# Patient Record
Sex: Female | Born: 1985 | Race: White | Hispanic: No | Marital: Married | Smoking: Never smoker
Health system: Southern US, Community
[De-identification: ages and names within clinical notes are randomized; demographics above are authoritative.]

## PROBLEM LIST (undated history)

## (undated) DIAGNOSIS — L309 Dermatitis, unspecified: Secondary | ICD-10-CM

## (undated) DIAGNOSIS — Z789 Other specified health status: Secondary | ICD-10-CM

## (undated) HISTORY — DX: Dermatitis, unspecified: L30.9

## (undated) HISTORY — PX: NO PAST SURGERIES: SHX2092

---

## 2012-02-05 ENCOUNTER — Other Ambulatory Visit: Payer: Self-pay | Admitting: Family Medicine

## 2014-03-09 ENCOUNTER — Ambulatory Visit (INDEPENDENT_AMBULATORY_CARE_PROVIDER_SITE_OTHER): Payer: No Typology Code available for payment source | Admitting: Family Medicine

## 2014-03-09 ENCOUNTER — Ambulatory Visit (INDEPENDENT_AMBULATORY_CARE_PROVIDER_SITE_OTHER): Payer: No Typology Code available for payment source

## 2014-03-09 VITALS — BP 100/60 | HR 69 | Temp 98.1°F | Resp 16 | Ht 63.0 in | Wt 122.0 lb

## 2014-03-09 DIAGNOSIS — S99929A Unspecified injury of unspecified foot, initial encounter: Secondary | ICD-10-CM

## 2014-03-09 DIAGNOSIS — S90129A Contusion of unspecified lesser toe(s) without damage to nail, initial encounter: Secondary | ICD-10-CM

## 2014-03-09 DIAGNOSIS — S99922A Unspecified injury of left foot, initial encounter: Secondary | ICD-10-CM

## 2014-03-09 DIAGNOSIS — S99919A Unspecified injury of unspecified ankle, initial encounter: Secondary | ICD-10-CM

## 2014-03-09 DIAGNOSIS — S8990XA Unspecified injury of unspecified lower leg, initial encounter: Secondary | ICD-10-CM

## 2014-03-09 DIAGNOSIS — S90122A Contusion of left lesser toe(s) without damage to nail, initial encounter: Secondary | ICD-10-CM

## 2014-03-09 LAB — POCT URINE PREGNANCY: Preg Test, Ur: NEGATIVE

## 2014-03-09 NOTE — Patient Instructions (Signed)
Ice and elevate your foot when you can.  You can "buddy tape" your 3rd and 4th toes for support.  I do not see a fracture of your toe, but I will let you know if the radiologist does see a fracture.

## 2014-03-09 NOTE — Progress Notes (Signed)
[facility] [address], [place] [address] [phone]  Date:  03/09/2014   Name:  Linda Roman   DOB:  [birth year]   MRN:  [id number]  PCP:  No PCP Per Patient    Chief Complaint: Toe Injury   History of Present Illness:  Linda Roman is a 28 y.o. very pleasant female patient who presents with the following:  Here today with an injury- last night she stubbed her left 4th toe on some stone steps.  She is able to "hobble" but the toe is painful.   She is generally healthy, otherwise unhurt and has no other complaint today Her LMP was about 6 weeks ago.   She is SA but they use condoms.  Her menses are irregular at baseline and she is not worried about pregnancy   There are no active problems to display for this patient.   Past Medical History  Diagnosis Date  . Eczema     History reviewed. No pertinent past surgical history.  History  Substance Use Topics  . Smoking status: Never Smoker   . Smokeless tobacco: Not on file  . Alcohol Use: Not on file    Family History  Problem Relation Age of Onset  . Diabetes Father   . High Cholesterol Father   . Heart disease Maternal Grandmother   . Heart disease Maternal Grandfather   . Diabetes Maternal Grandfather   . High Cholesterol Paternal Grandmother   . High Cholesterol Paternal Grandfather     No Known Allergies  Medication list has been reviewed and updated.  No current outpatient prescriptions on file prior to visit.   No current facility-administered medications on file prior to visit.    Review of Systems:  As per HPI- otherwise negative.   Physical Examination: Filed Vitals:   03/09/14 1513  BP: 100/60  Pulse: 69  Temp: 98.1 F (36.7 C)  Resp: 16   Filed Vitals:   03/09/14 1513  Height: 5\' 3"  (1.6 m)  Weight: 122 lb (55.339 kg)   Body mass index is 21.62 kg/(m^2). Ideal Body Weight: Weight in (lb) to have BMI = 25: 140.8  GEN: WDWN, NAD, Non-toxic, A & O x 3,  slim, looks well HEENT: Atraumatic, Normocephalic. Neck supple. No masses, No LAD. Ears and Nose: No external deformity. CV: RRR, No M/G/R. No JVD. No thrill. No extra heart sounds. PULM: CTA B, no wheezes, crackles, rhonchi. No retractions. No resp. distress. No accessory muscle use. EXTR: No c/c/e NEURO Normal gait. Not favoring left foot PSYCH: Normally interactive. Conversant. Not depressed or anxious appearing.  Calm demeanor.  Left foot: she is tender and bruised over the left 4th toe.  No swelling or deformity, no wound.  The rest of her foot and ankle are negative   [facility] reading (PRIMARY) by  Dr. Lorelei Pont. Left foot: negative for fracture  LEFT FOOT - COMPLETE 3+ VIEW  COMPARISON: None.  FINDINGS: There is no evidence of fracture or dislocation. There is no evidence of arthropathy or other focal bone abnormality. Soft tissues are unremarkable.  IMPRESSION: Negative.  Results for orders placed in visit on 03/09/14  POCT URINE PREGNANCY      Result Value Ref Range   Preg Test, Ur Negative      Assessment and Plan: Injury of left toe, initial encounter - Plan: DG Foot Complete Left, POCT urine pregnancy  Toe contusion, left, initial encounter  Contused toe.  Buddy taped to the 3rd toe.  Continue this until no longer painful.  Let me know if pain persists beyond a couple of weeks  See patient instructions for more details.     Signed Lamar Blinks, MD

## 2015-05-25 IMAGING — CR DG FOOT COMPLETE 3+V*L*
3 series · 3 of 3 positions shown · non-contrast
Comparison: None.

CLINICAL DATA: Pain in the left fourth toe

EXAM:
LEFT FOOT - COMPLETE 3+ VIEW

[AP]
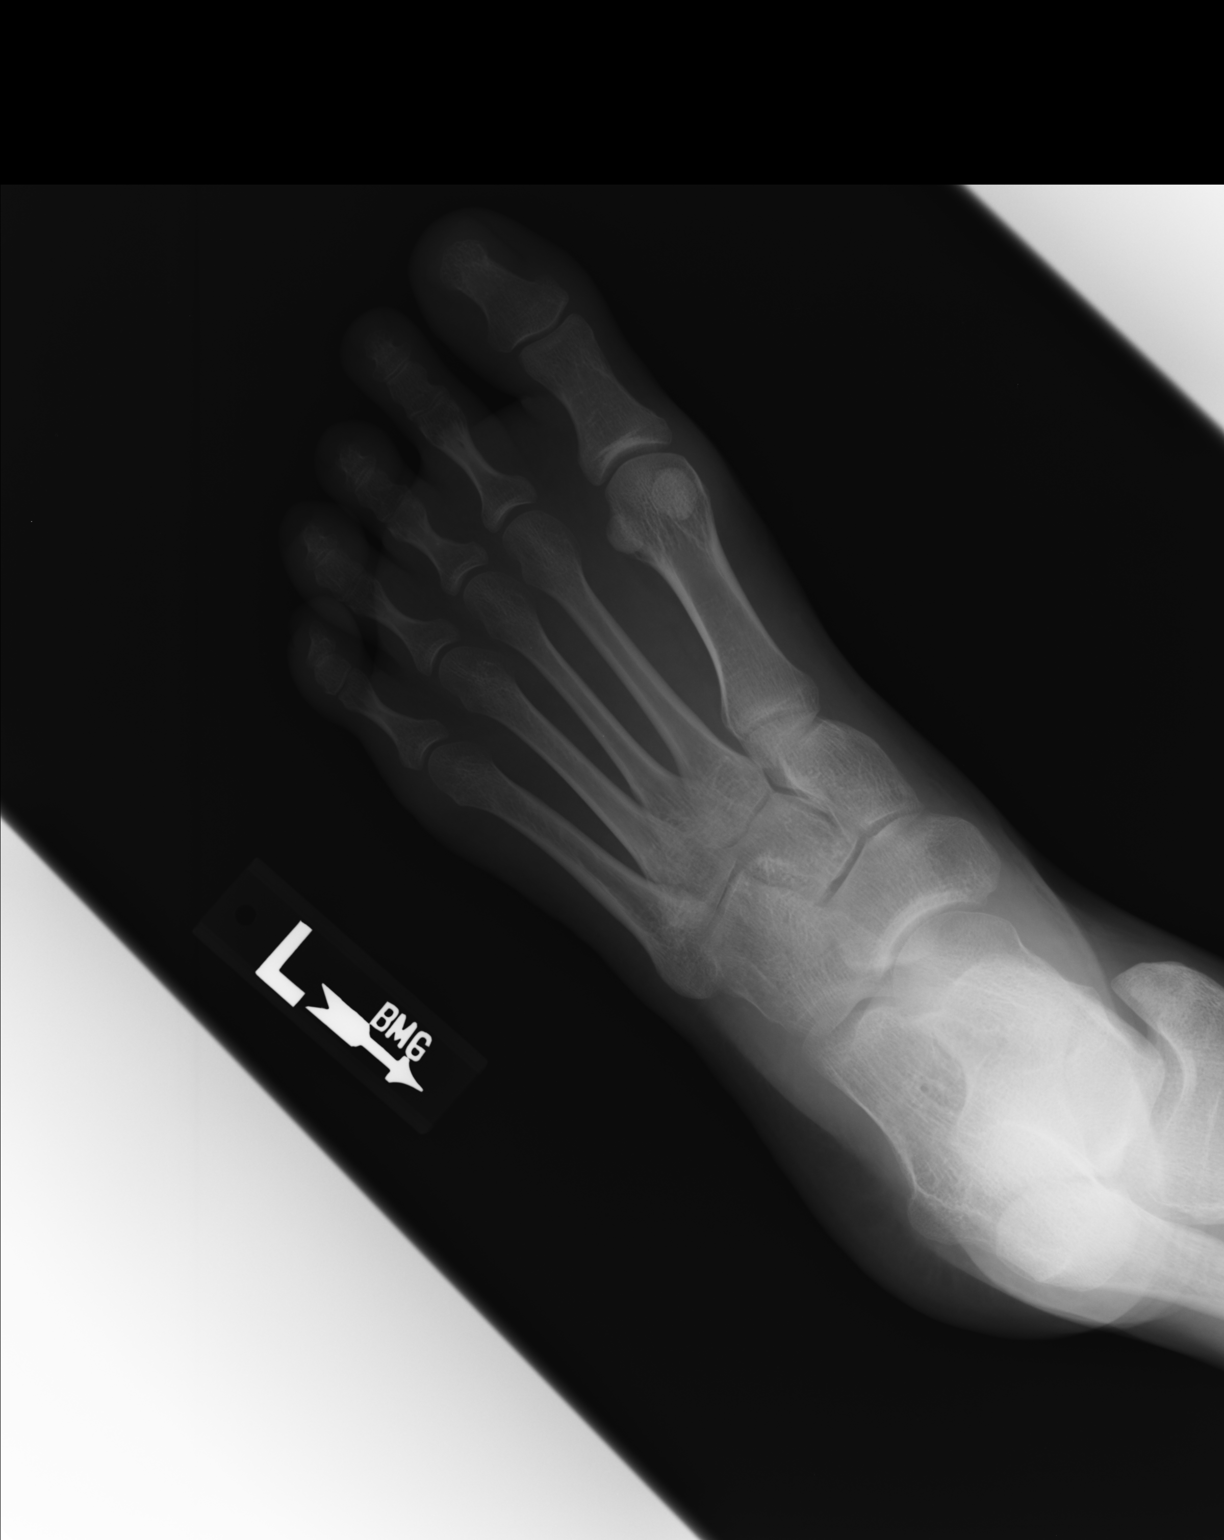

[ap obl int rot]
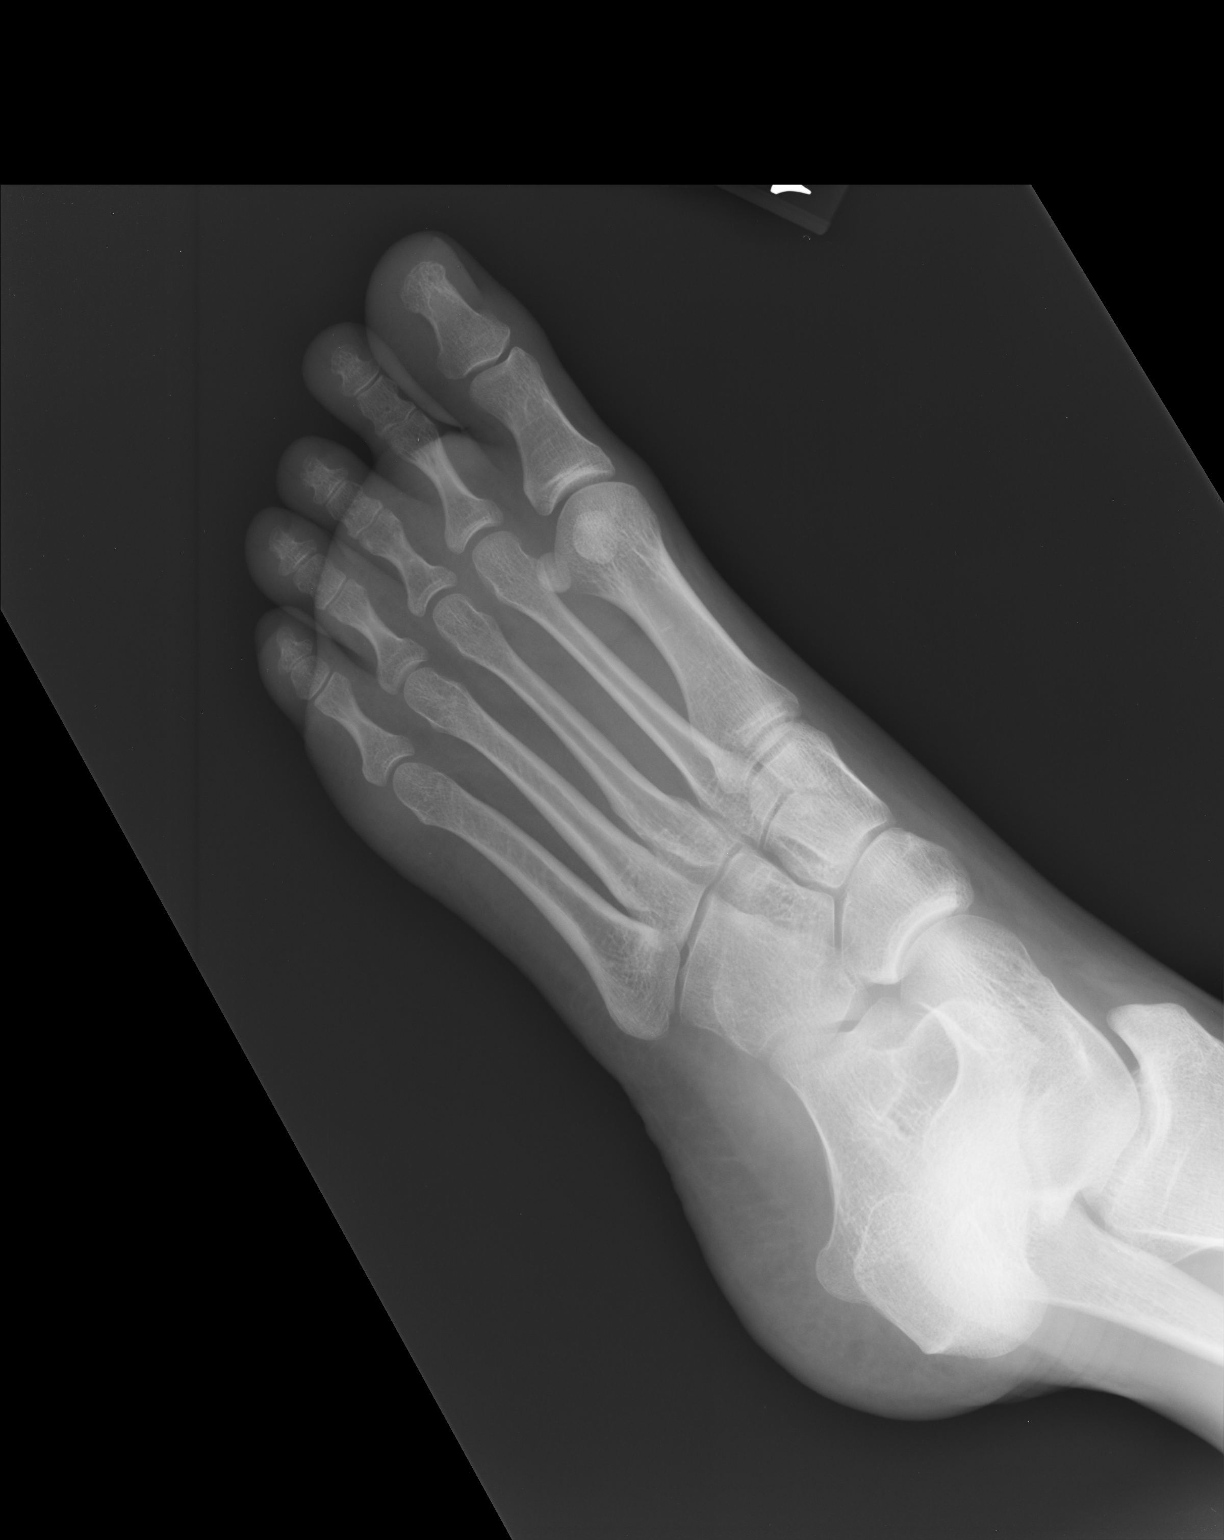

[lateral]
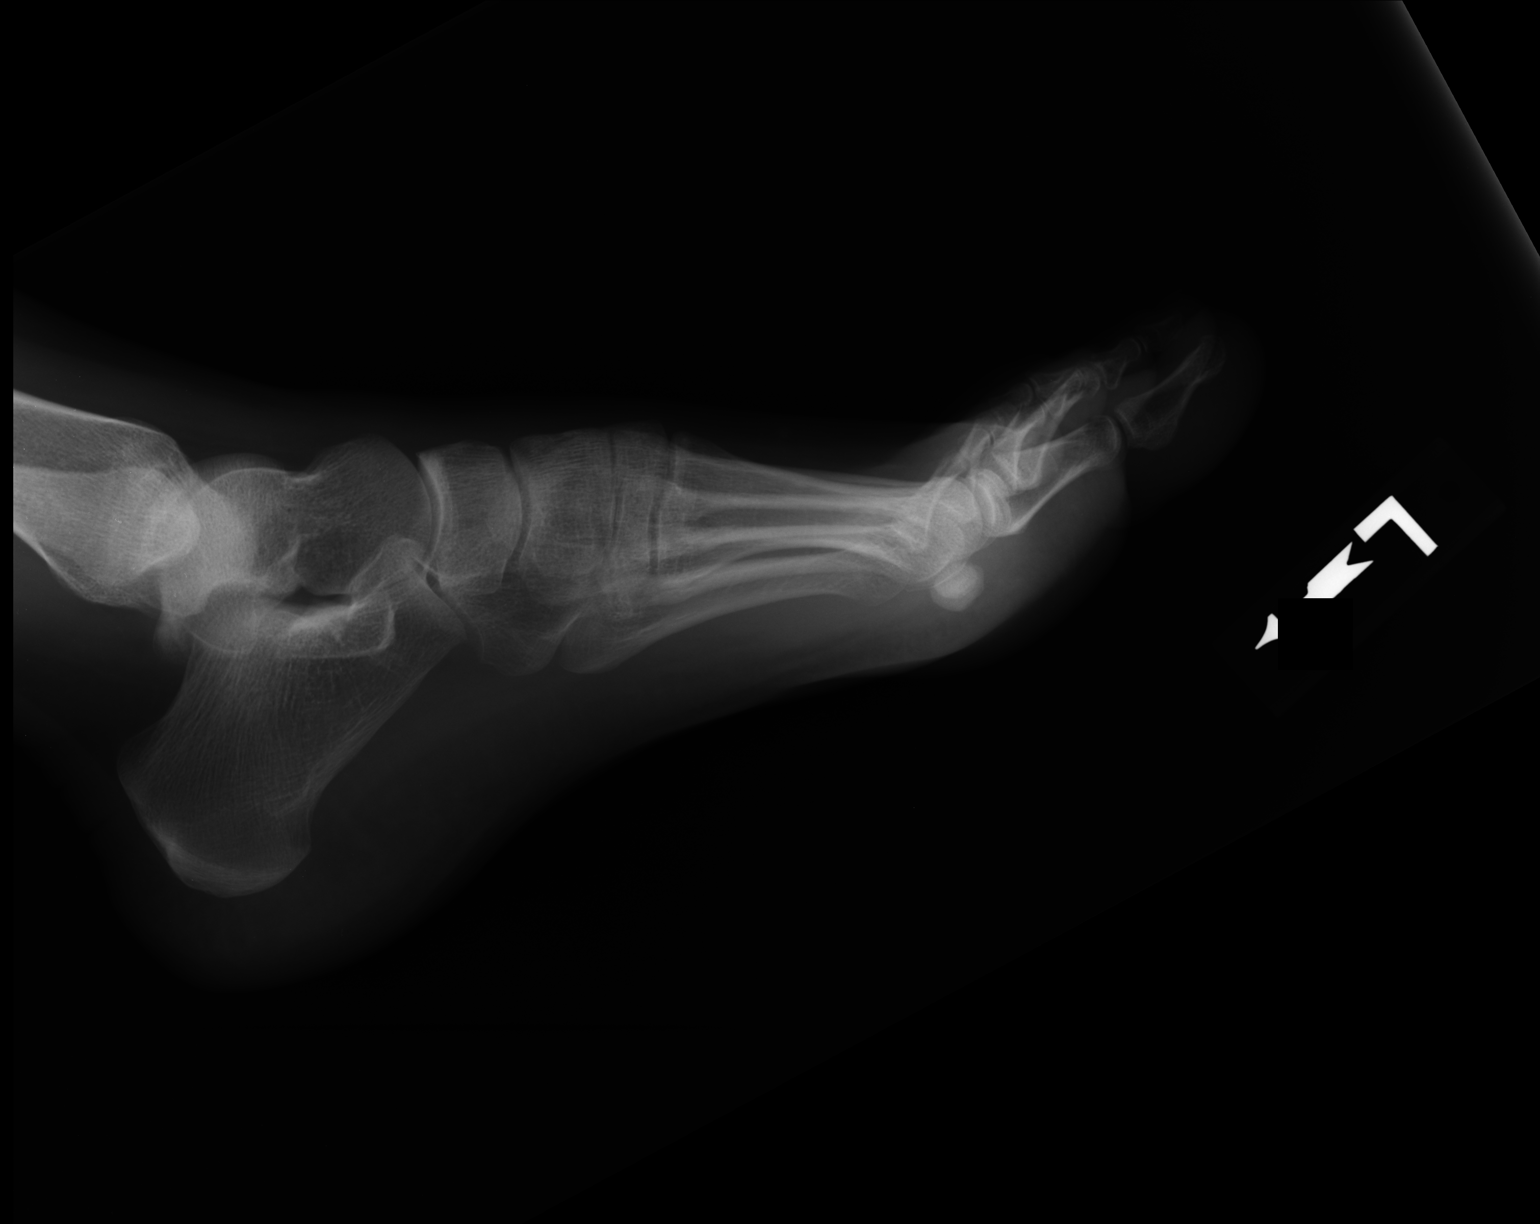

[3 of 3 positions shown; findings below may reference images not displayed]

FINDINGS: There is no evidence of fracture or dislocation. There is no
evidence of arthropathy or other focal bone abnormality. Soft
tissues are unremarkable.
IMPRESSION: Negative.

## 2017-02-21 LAB — OB RESULTS CONSOLE ABO/RH: RH Type: POSITIVE

## 2017-02-21 LAB — OB RESULTS CONSOLE ANTIBODY SCREEN: ANTIBODY SCREEN: NEGATIVE

## 2017-02-21 LAB — OB RESULTS CONSOLE HIV ANTIBODY (ROUTINE TESTING): HIV: NONREACTIVE

## 2017-02-21 LAB — OB RESULTS CONSOLE GC/CHLAMYDIA
CHLAMYDIA, DNA PROBE: NEGATIVE
GC PROBE AMP, GENITAL: NEGATIVE

## 2017-02-21 LAB — OB RESULTS CONSOLE RPR: RPR: NONREACTIVE

## 2017-02-21 LAB — OB RESULTS CONSOLE RUBELLA ANTIBODY, IGM: RUBELLA: IMMUNE

## 2017-02-21 LAB — OB RESULTS CONSOLE HEPATITIS B SURFACE ANTIGEN: Hepatitis B Surface Ag: NEGATIVE

## 2017-08-14 LAB — OB RESULTS CONSOLE GBS: STREP GROUP B AG: POSITIVE

## 2017-09-11 NOTE — L&D Delivery Note (Signed)
Operative Delivery Note Completely dilated at 1.15 pm. Started to push at 2.30 pm, station at +2/5, LOT position, after 20 min with no progress, we stopped and turned pt in lateral position with peanut. Restarted pushing 3.30 pm, good effort, favoring OP and failed manual rotation. Thick perineal body despite pushing and head at +3 +4.  D/w pt episiotomy and she agreed. After episiotomy head delivery was still difficult due to OP position and maternal effort getting weaker. D/w pt vacuum assisted delivery, reviewed risks/complication but the application was at +4 station mainly to achieve flexion and rotation from asynclitic position and agreed.   At 5:52 PM a viable and healthy female was delivered via Vaginal, Kiwi vacuum assisted with one controlled pull and 3 pushing efforts over one contraction. Presentation: vertex; Position: Left,, Occiput,, Anterior; Station: +4.  APGAR: 8, 8; weight -pending  Placenta status: spontaneous and complete .   Cord:  with the following complications: None.  Cord pH (arterial) sent   Anesthesia:  Epidural and local 1% plain lidocaine Instruments: Kiwi Episiotomy: Median (2nd degree, no extension) Lacerations: none other Suture Repair: 3.0 vicryl rapide Est. Blood Loss (mL):  300  Mom to postpartum.  Baby to Couplet care / Skin to Skin.  Linda Roman 09/14/2017, 7:05 PM

## 2017-09-13 ENCOUNTER — Inpatient Hospital Stay (HOSPITAL_COMMUNITY)
Admission: AD | Admit: 2017-09-13 | Discharge: 2017-09-16 | DRG: 806 | Disposition: A | Discharge status: Home / Self Care | Payer: Commercial Managed Care - PPO | Source: Ambulatory Visit | Attending: Obstetrics & Gynecology | Admitting: Obstetrics & Gynecology

## 2017-09-13 ENCOUNTER — Encounter (HOSPITAL_COMMUNITY): Payer: Self-pay

## 2017-09-13 ENCOUNTER — Other Ambulatory Visit: Payer: Self-pay

## 2017-09-13 DIAGNOSIS — D62 Acute posthemorrhagic anemia: Secondary | ICD-10-CM | POA: Diagnosis not present

## 2017-09-13 DIAGNOSIS — D259 Leiomyoma of uterus, unspecified: Secondary | ICD-10-CM | POA: Diagnosis present

## 2017-09-13 DIAGNOSIS — Z3A39 39 weeks gestation of pregnancy: Secondary | ICD-10-CM

## 2017-09-13 DIAGNOSIS — O9962 Diseases of the digestive system complicating childbirth: Secondary | ICD-10-CM | POA: Diagnosis present

## 2017-09-13 DIAGNOSIS — E669 Obesity, unspecified: Secondary | ICD-10-CM | POA: Diagnosis present

## 2017-09-13 DIAGNOSIS — O9081 Anemia of the puerperium: Secondary | ICD-10-CM | POA: Diagnosis not present

## 2017-09-13 DIAGNOSIS — O874 Varicose veins of lower extremity in the puerperium: Secondary | ICD-10-CM | POA: Diagnosis present

## 2017-09-13 DIAGNOSIS — K219 Gastro-esophageal reflux disease without esophagitis: Secondary | ICD-10-CM | POA: Diagnosis present

## 2017-09-13 DIAGNOSIS — O99214 Obesity complicating childbirth: Secondary | ICD-10-CM | POA: Diagnosis present

## 2017-09-13 DIAGNOSIS — O3413 Maternal care for benign tumor of corpus uteri, third trimester: Secondary | ICD-10-CM | POA: Diagnosis present

## 2017-09-13 DIAGNOSIS — O99824 Streptococcus B carrier state complicating childbirth: Secondary | ICD-10-CM | POA: Diagnosis present

## 2017-09-13 DIAGNOSIS — O4292 Full-term premature rupture of membranes, unspecified as to length of time between rupture and onset of labor: Secondary | ICD-10-CM | POA: Diagnosis present

## 2017-09-13 DIAGNOSIS — O358XX0 Maternal care for other (suspected) fetal abnormality and damage, not applicable or unspecified: Secondary | ICD-10-CM | POA: Diagnosis present

## 2017-09-13 DIAGNOSIS — Z8759 Personal history of other complications of pregnancy, childbirth and the puerperium: Secondary | ICD-10-CM

## 2017-09-13 HISTORY — DX: Other specified health status: Z78.9

## 2017-09-13 LAB — CBC
HEMATOCRIT: 34.8 % — AB (ref 36.0–46.0)
HEMOGLOBIN: 11.3 g/dL — AB (ref 12.0–15.0)
MCH: 28.2 pg (ref 26.0–34.0)
MCHC: 32.5 g/dL (ref 30.0–36.0)
MCV: 86.8 fL (ref 78.0–100.0)
Platelets: 234 10*3/uL (ref 150–400)
RBC: 4.01 MIL/uL (ref 3.87–5.11)
RDW: 13.7 % (ref 11.5–15.5)
WBC: 13.5 10*3/uL — ABNORMAL HIGH (ref 4.0–10.5)

## 2017-09-13 LAB — POCT FERN TEST: POCT FERN TEST: POSITIVE

## 2017-09-13 MED ORDER — PENICILLIN G POT IN DEXTROSE 60000 UNIT/ML IV SOLN
3.0000 10*6.[IU] | INTRAVENOUS | Status: DC
  Administered 2017-09-14 (×4): 3 10*6.[IU] via INTRAVENOUS
  Filled 2017-09-13 (×6): qty 50

## 2017-09-13 MED ORDER — LACTATED RINGERS IV SOLN: 500.0000 mL | INTRAVENOUS | Status: DC | PRN

## 2017-09-13 MED ORDER — LIDOCAINE HCL (PF) 1 % IJ SOLN
30.0000 mL | INTRAMUSCULAR | Status: DC | PRN
  Administered 2017-09-14: 30 mL via SUBCUTANEOUS
  Filled 2017-09-13: qty 30

## 2017-09-13 MED ORDER — OXYTOCIN BOLUS FROM INFUSION
500.0000 mL | Freq: Once | INTRAVENOUS | Status: AC
  Administered 2017-09-14: 500 mL via INTRAVENOUS

## 2017-09-13 MED ORDER — SOD CITRATE-CITRIC ACID 500-334 MG/5ML PO SOLN: 30.0000 mL | ORAL | Status: DC | PRN

## 2017-09-13 MED ORDER — OXYTOCIN 40 UNITS IN LACTATED RINGERS INFUSION - SIMPLE MED
2.5000 [IU]/h | INTRAVENOUS | Status: DC
  Filled 2017-09-13: qty 1000

## 2017-09-13 MED ORDER — PENICILLIN G POTASSIUM 5000000 UNITS IJ SOLR
5.0000 10*6.[IU] | Freq: Once | INTRAVENOUS | Status: AC
  Administered 2017-09-13: 5 10*6.[IU] via INTRAVENOUS
  Filled 2017-09-13: qty 5

## 2017-09-13 MED ORDER — ACETAMINOPHEN 325 MG PO TABS
650.0000 mg | ORAL_TABLET | ORAL | Status: DC | PRN
  Filled 2017-09-13: qty 2

## 2017-09-13 MED ORDER — OXYCODONE-ACETAMINOPHEN 5-325 MG PO TABS: 1.0000 | ORAL_TABLET | ORAL | Status: DC | PRN

## 2017-09-13 MED ORDER — FLEET ENEMA 7-19 GM/118ML RE ENEM: 1.0000 | ENEMA | RECTAL | Status: DC | PRN

## 2017-09-13 MED ORDER — OXYCODONE-ACETAMINOPHEN 5-325 MG PO TABS: 2.0000 | ORAL_TABLET | ORAL | Status: DC | PRN

## 2017-09-13 MED ORDER — LACTATED RINGERS IV SOLN
INTRAVENOUS | Status: DC
  Administered 2017-09-13 – 2017-09-14 (×2): via INTRAVENOUS

## 2017-09-13 MED ORDER — ONDANSETRON HCL 4 MG/2ML IJ SOLN
4.0000 mg | Freq: Four times a day (QID) | INTRAMUSCULAR | Status: DC | PRN
  Administered 2017-09-14: 4 mg via INTRAVENOUS
  Filled 2017-09-13: qty 2

## 2017-09-13 NOTE — MAU Note (Signed)
Monitor on and cardio placed on bedside cart with gel applied, tracing due to movement of monitor from 2148-2153, not FHR. Monitors applied on pt at 2200.

## 2017-09-13 NOTE — MAU Note (Signed)
Pt presents to MAU with c/o PROM around 10 pm tonight, felt small gush of fluid and since then has been leaking fluid. Pt denies vaginal bleeding and has not had contractions.+FM

## 2017-09-14 ENCOUNTER — Inpatient Hospital Stay (HOSPITAL_COMMUNITY): Payer: Commercial Managed Care - PPO | Admitting: Anesthesiology

## 2017-09-14 ENCOUNTER — Encounter (HOSPITAL_COMMUNITY): Payer: Self-pay | Admitting: Obstetrics

## 2017-09-14 ENCOUNTER — Other Ambulatory Visit: Payer: Self-pay

## 2017-09-14 DIAGNOSIS — Z8759 Personal history of other complications of pregnancy, childbirth and the puerperium: Secondary | ICD-10-CM

## 2017-09-14 LAB — TYPE AND SCREEN
ABO/RH(D): A POS
Antibody Screen: NEGATIVE

## 2017-09-14 LAB — ABO/RH: ABO/RH(D): A POS

## 2017-09-14 LAB — RPR: RPR Ser Ql: NONREACTIVE

## 2017-09-14 MED ORDER — ONDANSETRON HCL 4 MG PO TABS: 4.0000 mg | ORAL_TABLET | ORAL | Status: DC | PRN

## 2017-09-14 MED ORDER — EPHEDRINE 5 MG/ML INJ
10.0000 mg | INTRAVENOUS | Status: DC | PRN
  Filled 2017-09-14: qty 2

## 2017-09-14 MED ORDER — LACTATED RINGERS IV SOLN: 500.0000 mL | Freq: Once | INTRAVENOUS | Status: DC

## 2017-09-14 MED ORDER — OXYCODONE HCL 5 MG PO TABS: 5.0000 mg | ORAL_TABLET | Freq: Four times a day (QID) | ORAL | Status: DC | PRN

## 2017-09-14 MED ORDER — FENTANYL 2.5 MCG/ML BUPIVACAINE 1/10 % EPIDURAL INFUSION (WH - ANES)
14.0000 mL/h | INTRAMUSCULAR | Status: DC | PRN
  Administered 2017-09-14 (×2): 14 mL/h via EPIDURAL
  Filled 2017-09-14 (×2): qty 100

## 2017-09-14 MED ORDER — OXYTOCIN 40 UNITS IN LACTATED RINGERS INFUSION - SIMPLE MED
1.0000 m[IU]/min | INTRAVENOUS | Status: DC
  Administered 2017-09-14: 1 m[IU]/min via INTRAVENOUS

## 2017-09-14 MED ORDER — WITCH HAZEL-GLYCERIN EX PADS: 1.0000 "application " | MEDICATED_PAD | CUTANEOUS | Status: DC | PRN

## 2017-09-14 MED ORDER — COCONUT OIL OIL: 1.0000 "application " | TOPICAL_OIL | Status: DC | PRN

## 2017-09-14 MED ORDER — BENZOCAINE-MENTHOL 20-0.5 % EX AERO
1.0000 "application " | INHALATION_SPRAY | CUTANEOUS | Status: DC | PRN
  Administered 2017-09-15: 1 via TOPICAL
  Filled 2017-09-14: qty 56

## 2017-09-14 MED ORDER — LACTATED RINGERS IV BOLUS (SEPSIS)
500.0000 mL | Freq: Once | INTRAVENOUS | Status: AC
  Administered 2017-09-14: 500 mL via INTRAVENOUS

## 2017-09-14 MED ORDER — PRENATAL MULTIVITAMIN CH
1.0000 | ORAL_TABLET | Freq: Every day | ORAL | Status: DC
  Administered 2017-09-15 – 2017-09-16 (×2): 1 via ORAL
  Filled 2017-09-14 (×2): qty 1

## 2017-09-14 MED ORDER — PANTOPRAZOLE SODIUM 40 MG PO TBEC
40.0000 mg | DELAYED_RELEASE_TABLET | Freq: Every day | ORAL | Status: DC
  Administered 2017-09-15 – 2017-09-16 (×2): 40 mg via ORAL
  Filled 2017-09-14 (×2): qty 1

## 2017-09-14 MED ORDER — IBUPROFEN 600 MG PO TABS
600.0000 mg | ORAL_TABLET | Freq: Four times a day (QID) | ORAL | Status: DC
  Administered 2017-09-15 – 2017-09-16 (×7): 600 mg via ORAL
  Filled 2017-09-14 (×8): qty 1

## 2017-09-14 MED ORDER — TERBUTALINE SULFATE 1 MG/ML IJ SOLN
0.2500 mg | Freq: Once | INTRAMUSCULAR | Status: DC | PRN
  Filled 2017-09-14: qty 1

## 2017-09-14 MED ORDER — LIDOCAINE HCL (PF) 1 % IJ SOLN
INTRAMUSCULAR | Status: DC | PRN
  Administered 2017-09-14: 2 mL via EPIDURAL
  Administered 2017-09-14: 3 mL via EPIDURAL
  Administered 2017-09-14: 5 mL via EPIDURAL

## 2017-09-14 MED ORDER — TETANUS-DIPHTH-ACELL PERTUSSIS 5-2.5-18.5 LF-MCG/0.5 IM SUSP: 0.5000 mL | Freq: Once | INTRAMUSCULAR | Status: DC

## 2017-09-14 MED ORDER — SENNOSIDES-DOCUSATE SODIUM 8.6-50 MG PO TABS
2.0000 | ORAL_TABLET | ORAL | Status: DC
  Administered 2017-09-15 – 2017-09-16 (×2): 2 via ORAL
  Filled 2017-09-14 (×2): qty 2

## 2017-09-14 MED ORDER — SIMETHICONE 80 MG PO CHEW: 80.0000 mg | CHEWABLE_TABLET | ORAL | Status: DC | PRN

## 2017-09-14 MED ORDER — LACTATED RINGERS IV SOLN
500.0000 mL | Freq: Once | INTRAVENOUS | Status: AC
  Administered 2017-09-14: 500 mL via INTRAVENOUS

## 2017-09-14 MED ORDER — ACETAMINOPHEN 325 MG PO TABS
650.0000 mg | ORAL_TABLET | ORAL | Status: DC | PRN
  Administered 2017-09-15: 650 mg via ORAL
  Filled 2017-09-14: qty 2

## 2017-09-14 MED ORDER — ZOLPIDEM TARTRATE 5 MG PO TABS: 5.0000 mg | ORAL_TABLET | Freq: Every evening | ORAL | Status: DC | PRN

## 2017-09-14 MED ORDER — DIPHENHYDRAMINE HCL 50 MG/ML IJ SOLN: 12.5000 mg | INTRAMUSCULAR | Status: DC | PRN

## 2017-09-14 MED ORDER — DIBUCAINE 1 % RE OINT: 1.0000 "application " | TOPICAL_OINTMENT | RECTAL | Status: DC | PRN

## 2017-09-14 MED ORDER — ONDANSETRON HCL 4 MG/2ML IJ SOLN: 4.0000 mg | INTRAMUSCULAR | Status: DC | PRN

## 2017-09-14 MED ORDER — LACTATED RINGERS IV SOLN
INTRAVENOUS | Status: DC
  Administered 2017-09-14: 12:00:00 via INTRAUTERINE

## 2017-09-14 MED ORDER — DIPHENHYDRAMINE HCL 25 MG PO CAPS: 25.0000 mg | ORAL_CAPSULE | Freq: Four times a day (QID) | ORAL | Status: DC | PRN

## 2017-09-14 MED ORDER — PHENYLEPHRINE 40 MCG/ML (10ML) SYRINGE FOR IV PUSH (FOR BLOOD PRESSURE SUPPORT)
80.0000 ug | PREFILLED_SYRINGE | INTRAVENOUS | Status: DC | PRN
  Administered 2017-09-14 (×2): 80 ug via INTRAVENOUS
  Filled 2017-09-14: qty 5

## 2017-09-14 MED ORDER — PHENYLEPHRINE 40 MCG/ML (10ML) SYRINGE FOR IV PUSH (FOR BLOOD PRESSURE SUPPORT)
80.0000 ug | PREFILLED_SYRINGE | INTRAVENOUS | Status: DC | PRN
  Filled 2017-09-14: qty 10
  Filled 2017-09-14: qty 5

## 2017-09-14 NOTE — Progress Notes (Signed)
Very comfortable with epidural  BP 103/78   Pulse 81   Temp 97.9 F (36.6 C) (Oral)   Resp 18   Ht 5\' 2"  (1.575 m)   Wt 173 lb (78.5 kg)   LMP 12/12/2016   SpO2 98%   BMI 31.64 kg/m   A&Ox3 nml respirations Abd: soft, nt, gravid Cx: 3/53/-9; cephalic, clear fluid noted; iupc placed w/o difficulty; cervix very soft - spotting with exam LE: no edema, nt   FHT: 120s, normal variability, +accels, now repetitive  Variables with contractions; drop to about 10 bpm with variables, few to 90s Toco: q 2 min  A/p: iup at 39.3wga 1. srom - pit augmentation; making cervical change; plan svd 2. Cat 2 tracing  - begin amnioinfusion and watch closely, fetus overall reassuring 3. gbs positive - pcn 4. Rh pos

## 2017-09-14 NOTE — Progress Notes (Signed)
Pt was uncomfortable with contractions and epidural just placed.    Patient Vitals for the past 24 hrs:  BP Temp Temp src Pulse Resp SpO2 Height Weight  09/14/17 0531 101/67 - - (!) 106 16 100 % - -  09/14/17 0528 (!) 87/53 - - (!) 108 18 99 % - -  09/14/17 0527 (!) 67/19 - - (!) 180 18 99 % - -  09/14/17 0521 121/87 - - 86 18 100 % - -  09/14/17 0516 113/62 - - 94 18 100 % - -  09/14/17 0514 104/69 - - (!) 108 18 99 % - -  09/14/17 0501 94/65 - - 94 18 - - -  09/14/17 0431 110/73 - - (!) 135 18 - - -  09/14/17 0405 106/70 99.2 F (37.3 C) Oral (!) 108 18 - - -  09/14/17 0330 117/62 - - 88 16 - - -  09/14/17 0301 102/65 - - (!) 101 16 - - -  09/14/17 0235 105/66 - - 91 16 - - -  09/14/17 0233 (!) 82/62 - - (!) 101 16 - - -  09/14/17 0231 (!) 93/48 - - 85 16 - - -  09/14/17 0201 116/70 98.1 F (36.7 C) Oral (!) 111 18 - - -  09/14/17 0131 98/72 - - (!) 119 18 - - -  09/14/17 0101 115/69 - - (!) 109 18 - - -  09/14/17 0048 115/81 - - (!) 111 18 - - -  09/14/17 0043 - 98.7 F (37.1 C) Oral - - - - -  09/14/17 0018 109/74 - - (!) 116 16 - - -  09/13/17 2320 - - - - - - 5\' 2"  (1.575 m) 173 lb (78.5 kg)  09/13/17 2314 106/71 98.5 F (36.9 C) Oral 99 18 - - -  09/13/17 2202 117/77 98.2 F (36.8 C) Oral (!) 132 18 - - -   A&ox3 Normal respirations Abd: soft, nt nd; efw 7lbs Cx: 3/50/-3 (per nursing, exam deferred since just had exam) LE: trace edema, nt bilat; vericosities left foot/ankle, nt  FHT: 120s, normal variability, +accels, few variables Toco: irregular q 2  A/p: iup at 39.3 wga 1. srom - contin pitocin augmentation; plan svd 2. gbs positive - pcn for prophylaxis 3. Fetal status reassuring 4. Rh pos 5. Rubella immune

## 2017-09-14 NOTE — H&P (Signed)
  OB ADMISSION/ HISTORY & PHYSICAL:  Admission Date: 09/13/2017  9:41 PM  Admit Diagnosis: SROM at 39+3 weeks  Linda Roman is a 32 y.o. female G1P0 at 39+3 weeks presenting for spontaneous rupture of membranes at term around 9:15pm.  She reports the fluid as clear and odorless.    Prenatal History: G1P0   EDC : 09/18/2017, by Last Menstrual Period  Prenatal care at [facility]  Primary Ob Provider: Dr. Pamala Hurry  Prenatal course complicated by  - GBS Positive  - 3cm lower uterine segment fibroid - GERD - Spider/varicose veins of left ankle   Prenatal Labs: ABO, Rh: --/--/A POS (01/03 2245) Antibody: NEG (01/03 2245) Rubella: Immune (06/13 0000)  RPR: Nonreactive (06/13 0000)  HBsAg: Negative (06/13 0000)  HIV: Non-reactive (06/13 0000)  GTT: 97 GBS: Positive (12/04 0000)  NT WNL AFP- negative Normal CUS, female, posterior placenta   Medical / Surgical History :  Past medical history:  Past Medical History:  Diagnosis Date  . Eczema   . Medical history non-contributory      Past surgical history:  Past Surgical History:  Procedure Laterality Date  . NO PAST SURGERIES      Family History:  Family History  Problem Relation Age of Onset  . Diabetes Father   . High Cholesterol Father   . Heart disease Maternal Grandmother   . Heart disease Maternal Grandfather   . Diabetes Maternal Grandfather   . High Cholesterol Paternal Grandmother   . High Cholesterol Paternal Grandfather      Social History:  reports that  has never smoked. she has never used smokeless tobacco. She reports that she does not drink alcohol or use drugs.   Allergies: Patient has no known allergies.    Current Medications at time of admission:  Prior to Admission medications   Medication Sig Start Date End Date Taking? Authorizing Provider  Prenatal Vit-Fe Fumarate-FA (PRENATAL MULTIVITAMIN) TABS tablet Take 1 tablet by mouth daily at 12 noon.   Yes [provider]      Review of Systems: Active FM Onset of ctxs at 11pm  LOF  / SROM @ 9:15pm - clear fluid No bloody show   Physical Exam:  VS: Blood pressure 106/71, pulse 99, temperature 98.5 F (36.9 C), temperature source Oral, resp. rate 18, height 5\' 2"  (1.575 m), weight 78.5 kg (173 lb), last menstrual period 12/12/2016.  General: alert and oriented, appears anxious/excited Heart: RRR Lungs: Clear lung fields Abdomen: Gravid, soft and non-tender, non-distended / uterus: gravid, non-tender Extremities: trace left tibial edema with varicosities, no edema on right side   Genitalia / VE: Dilation: 1.5 Effacement (%): 30 Station: -3 Exam by:: [place], CNM   FHR: baseline rate 135 bpm/ variability  moderate / accelerations + 15x15 / occasional variable deceleration, currently none present  TOCO: 1-4 minutes/ mild   Assessment: 39+[redacted] weeks gestation Latent stage of labor FHR category 1 GBS Positive    Plan:  1. Admit to [facility]   - Routine labor and delivery orders   - Pain management: epidural in active labor   - Plan for IV Pitocin for augmentation at 1 milliunit and increase by 1 milliunit 2. GBS Positive    - PCN 5 million units x 1, then 3 million units every 4 hours   3. Postpartum:   - Breastfeeding 4. Anticipate MOD: NSVD   Dr. Murrell Redden notified of admission / plan of care  Lars Pinks, MSN, [facility]

## 2017-09-14 NOTE — Lactation Note (Addendum)
This note was copied from a baby's chart. Lactation Consultation Note  Patient Name: Linda Roman [id number] Date: 09/14/2017   P1, Baby 2.5 hours old.  Called to room due to cleft palate. Infant's suck is weak and she is very spitty. Reviewed hand expression w/ mother and colostrum freely flowed. Attempted latching in football upright position w/ chin support.  Baby sucked a few times and fell back asleep.   Set up DEBP and suggest mother pump q 3 hours and give volume back to baby when she cues.    Hand expressed and pumped 8 ml. Attempted spoon feeding and baby did not protrude tongue. Used finger syringe but infant's suck was not strong enough.  Volume spit back out of mouth. Virgel Bouquet charge RN given special feeder.  Slow flow nipples in room for when baby cues. Alver Fisher RN and Janett Billow RN aware. Reviewed cleaning and milk storage with mother. Mom made aware of O/P services, breastfeeding support groups, community resources, and our phone # for post-discharge questions.          Maternal Data    Feeding    LATCH Score                   Interventions    Lactation Tools Discussed/Used     Consult Status      Carlye Grippe 09/14/2017, 10:11 PM

## 2017-09-14 NOTE — Anesthesia Procedure Notes (Signed)
Epidural Patient location during procedure: OB Start time: 09/14/2017 5:09 AM End time: 09/14/2017 5:14 AM  Staffing Anesthesiologist: Catalina Gravel, MD Performed: anesthesiologist   Preanesthetic Checklist Completed: patient identified, pre-op evaluation, timeout performed, IV checked, risks and benefits discussed and monitors and equipment checked  Epidural Patient position: sitting Prep: DuraPrep Patient monitoring: blood pressure and continuous pulse ox Approach: midline Location: L3-L4 Injection technique: LOR air  Needle:  Needle type: Tuohy  Needle gauge: 17 G Needle length: 9 cm Needle insertion depth: 4.5 cm Catheter size: 19 Gauge Catheter at skin depth: 9 cm Test dose: negative and Other (1% Lidocaine)  Additional Notes Patient identified.  Risk benefits discussed including failed block, incomplete pain control, headache, nerve damage, paralysis, blood pressure changes, nausea, vomiting, reactions to medication both toxic or allergic, and postpartum back pain.  Patient expressed understanding and wished to proceed.  All questions were answered.  Sterile technique used throughout procedure and epidural site dressed with sterile barrier dressing. No paresthesia or other complications noted. The patient did not experience any signs of intravascular injection such as tinnitus or metallic taste in mouth nor signs of intrathecal spread such as rapid motor block. Please see nursing notes for vital signs. Reason for block:procedure for pain

## 2017-09-14 NOTE — Anesthesia Preprocedure Evaluation (Signed)
Anesthesia Evaluation  Patient identified by MRN, date of birth, ID band Patient awake    Reviewed: Allergy & Precautions, NPO status , Patient's Chart, lab work & pertinent test results  Airway Mallampati: II  TM Distance: >3 FB Neck ROM: Full    Dental  (+) Teeth Intact, Dental Advisory Given   Pulmonary neg pulmonary ROS,    Pulmonary exam normal breath sounds clear to auscultation       Cardiovascular negative cardio ROS Normal cardiovascular exam Rhythm:Regular Rate:Normal     Neuro/Psych negative neurological ROS  negative psych ROS   GI/Hepatic negative GI ROS, Neg liver ROS,   Endo/Other  Obesity   Renal/GU negative Renal ROS     Musculoskeletal negative musculoskeletal ROS (+)   Abdominal   Peds  Hematology negative hematology ROS (+) Blood dyscrasia, anemia , Plt 234k   Anesthesia Other Findings Day of surgery medications reviewed with the patient.  Reproductive/Obstetrics (+) Pregnancy                             Anesthesia Physical Anesthesia Plan  ASA: II  Anesthesia Plan: Epidural   Post-op Pain Management:    Induction:   PONV Risk Score and Plan: 2 and Treatment may vary due to age or medical condition  Airway Management Planned:   Additional Equipment:   Intra-op Plan:   Post-operative Plan:   Informed Consent: I have reviewed the patients History and Physical, chart, labs and discussed the procedure including the risks, benefits and alternatives for the proposed anesthesia with the patient or authorized representative who has indicated his/her understanding and acceptance.   Dental advisory given  Plan Discussed with:   Anesthesia Plan Comments: (Patient identified. Risks/Benefits/Options discussed with patient including but not limited to bleeding, infection, nerve damage, paralysis, failed block, incomplete pain control, headache, blood pressure  changes, nausea, vomiting, reactions to medication both or allergic, itching and postpartum back pain. Confirmed with bedside nurse the patient's most recent platelet count. Confirmed with patient that they are not currently taking any anticoagulation, have any bleeding history or any family history of bleeding disorders. Patient expressed understanding and wished to proceed. All questions were answered. )        Anesthesia Quick Evaluation

## 2017-09-14 NOTE — Progress Notes (Signed)
Informed Dr Benjie Karvonen of vaginal exam.  Ordered to decrease pitocin to 8 milli-units/minute and labor down for approximately 30 - 45 minutes

## 2017-09-14 NOTE — Anesthesia Pain Management Evaluation Note (Signed)
  CRNA Pain Management Visit Note  Patient: Linda Roman, 32 y.o., female  "Hello I am a member of the anesthesia team at [facility]. We have an anesthesia team available at all times to provide care throughout the hospital, including epidural management and anesthesia for C-section. I don't know your plan for the delivery whether it a natural birth, water birth, IV sedation, nitrous supplementation, doula or epidural, but we want to meet your pain goals."   1.Was your pain managed to your expectations on prior hospitalizations?   No prior hospitalizations  2.What is your expectation for pain management during this hospitalization?     Epidural  3.How can we help you reach that goal? Epidural in place and working well.  Record the patient's initial score and the patient's pain goal.   Pain: 0  Pain Goal: 5 [facility] wants you to be able to say your pain was always managed very well.  Jessicamarie Amiri 09/14/2017

## 2017-09-15 LAB — CBC
HCT: 31.3 % — ABNORMAL LOW (ref 36.0–46.0)
HEMOGLOBIN: 10.2 g/dL — AB (ref 12.0–15.0)
MCH: 28.5 pg (ref 26.0–34.0)
MCHC: 32.6 g/dL (ref 30.0–36.0)
MCV: 87.4 fL (ref 78.0–100.0)
PLATELETS: 217 10*3/uL (ref 150–400)
RBC: 3.58 MIL/uL — AB (ref 3.87–5.11)
RDW: 14.1 % (ref 11.5–15.5)
WBC: 15.1 10*3/uL — ABNORMAL HIGH (ref 4.0–10.5)

## 2017-09-15 NOTE — Progress Notes (Signed)
PPD 1 VAVD with median episiotomy  S:  Reports feeling really tired - otherwaise ok with some cramping and soreness             Tolerating po/ No nausea or vomiting             Bleeding is moderate             Pain controlled with motrin and oxycodone PRN             Up ad lib / ambulatory / voiding QS  Newborn with significant feeding difficulties / dx cleft palate / lactation and pediatrician management  O:    VS: BP (!) 107/59 (BP Location: Right Arm)   Pulse 98   Temp 98.3 F (36.8 C) (Oral)   Resp 18   Ht 5\' 2"  (1.575 m)   Wt 78.5 kg (173 lb)   LMP 12/12/2016   SpO2 98%   Breastfeeding? Unknown   BMI 31.64 kg/m              Recent Labs    09/13/17 2245 09/15/17 0519  WBC 13.5* 15.1*  HGB 11.3* 10.2*  PLT 234 217               Blood type: --/--/A POS, A POS (01/03 2245)  Rubella: Immune (06/13 0000)                   Flu and tdap current 2018               I&O: Intake/Output      01/04 0701 - 01/05 0700 01/05 0701 - 01/06 0700   Urine (mL/kg/hr) 3550 (1.9)    Blood 300    Total Output 3850    Net -3850                    Physical Exam:             Alert and oriented X3  Abdomen: soft, non-tender, non-distended              Fundus: firm, non-tender, Ueven  Perineum: ice pack in place - minimal edema  Lochia: light   Extremities: no edema, no calf pain or tenderness   A: PPD # 1 VAVD with median episiotomy - no extension   Doing well - stable status  P: Routine post partum orders  Breastfeeding and lactation assistance necessary with newborn -cleft palate  Artelia Laroche CNM, MSN, [facility] 09/15/2017, 8:23 AM

## 2017-09-15 NOTE — Anesthesia Postprocedure Evaluation (Signed)
Anesthesia Post Note  Patient: Linda Roman  Procedure(s) Performed: AN AD [place]     Patient location during evaluation: Mother Baby Anesthesia Type: Epidural Level of consciousness: awake and alert Pain management: pain level controlled Vital Signs Assessment: post-procedure vital signs reviewed and stable Respiratory status: spontaneous breathing, nonlabored ventilation and respiratory function stable Cardiovascular status: stable Postop Assessment: no headache, no backache and epidural receding Anesthetic complications: no    Last Vitals:  Vitals:   09/14/17 2100 09/15/17 0010  BP: 103/61 (!) 107/59  Pulse: 91 98  Resp: 16 18  Temp: 36.8 C 36.8 C  SpO2:      Last Pain:  Vitals:   09/15/17 0536  TempSrc:   PainSc: 2    Pain Goal:                 Linda Roman

## 2017-09-15 NOTE — Lactation Note (Addendum)
This note was copied from a baby's chart. 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 Baby is taking supplementation with a Pigeon feeder provided by the NICU. Baby's intake has increased now to 5 ml. per feeding, discussed with parents about baby's needs in the first 24 hours of life and the need of start increasing the feedings to 10 ml. Encouraged mom to keep hand expressing and to start pumping again and talked about the importance of providing breast milk for baby. Baby is due for her next feeding at 5 pm but mom is aware that she should offer a feeding sooner if feeding cues are present. Advised her to keep putting baby skin to skin.  [name] S. [place] Calix, [place], [place]

## 2017-09-15 NOTE — Addendum Note (Signed)
Addendum  created 09/15/17 0810 by Rayvon Char, CRNA   Charge Capture section accepted, Sign clinical note

## 2017-09-15 NOTE — Anesthesia Postprocedure Evaluation (Signed)
Anesthesia Post Note  Patient: Linda Roman  Procedure(s) Performed: AN AD [place]     Patient location during evaluation: Mother Baby Anesthesia Type: Epidural Level of consciousness: awake and alert Pain management: pain level controlled Vital Signs Assessment: post-procedure vital signs reviewed and stable Respiratory status: spontaneous breathing, nonlabored ventilation and respiratory function stable Cardiovascular status: stable Postop Assessment: no headache, no backache, epidural receding and patient able to bend at knees Anesthetic complications: no    Last Vitals:  Vitals:   09/14/17 2100 09/15/17 0010  BP: 103/61 (!) 107/59  Pulse: 91 98  Resp: 16 18  Temp: 36.8 C 36.8 C  SpO2:      Last Pain:  Vitals:   09/15/17 0536  TempSrc:   PainSc: 2    Pain Goal:                 Rayvon Char

## 2017-09-16 MED ORDER — IBUPROFEN 600 MG PO TABS: 600.0000 mg | ORAL_TABLET | Freq: Four times a day (QID) | ORAL | 0 refills | Status: AC

## 2017-09-16 NOTE — Progress Notes (Signed)
PPD #2, VAVD, median episiotomy, baby girl "Linda Roman"  S:  Reports feeling better today, soreness improving             Tolerating po/ No nausea or vomiting / Denies dizziness or SOB             Bleeding is light             Pain controlled with Motrin             Up ad lib / ambulatory / voiding QS  Newborn with bilateral cleft palate - breast feeding with formula using the [employer].  Feeling much better about feeding and states baby is doing better and feeding every 2 hours. Speech and OT to see baby tomorrow.   O:               VS: BP (!) 100/55 (BP Location: Right Arm)   Pulse 88   Temp 98.2 F (36.8 C) (Axillary)   Resp 18   Ht 5\' 2"  (1.575 m)   Wt 78.5 kg (173 lb)   LMP 12/12/2016   SpO2 99%   Breastfeeding? Unknown   BMI 31.64 kg/m    LABS:              Recent Labs    09/13/17 2245 09/15/17 0519  WBC 13.5* 15.1*  HGB 11.3* 10.2*  PLT 234 217               Blood type: --/--/A POS, A POS (01/03 2245)  Rubella: Immune (06/13 0000)                           Physical Exam:             Alert and oriented X3  Lungs: Clear and unlabored  Heart: regular rate and rhythm / no murmurs  Abdomen: soft, non-tender, non-distended              Fundus: firm, non-tender, U-3  Perineum: well approximated median episiotomy, no significant edema or erythema, no ecchymossi  Lochia: light  Extremities: trace pedal edema, no calf pain or tenderness    A: PPD # 2, VAVD  Median Episiotomy - no extension   ABL Anemia - stable asymptomatic  Newborn with bilateral cleft palate   Doing well - stable status  P: Routine post partum orders  Encouraged to take OTC oral FE if she becomes symptomatic, pt. Wants to wait due to constipation issues  D/C home today, but will stay overnight due to baby being a patient  WOB discharge book and instruction given  EPDS score - 7 - encourage [facility] nurse services and 2 week interval visit; she will call to schedule   Lars Pinks, MSN, CNM [facility]

## 2017-09-16 NOTE — Discharge Summary (Signed)
Obstetric Discharge Summary   Patient Name: Linda Roman DOB: 11/23/1985 MRN: [id number]  Date of Admission: 09/13/2017 Date of Discharge: 09/16/2017 Date of Delivery: 09/14/17 Gestational Age at Delivery: [redacted]w[redacted]d  Primary OB: [facility] - Dr. Pamala Hurry  Antepartum complications:  - GBS Positive  - 3cm lower uterine segment fibroid - GERD - Spider/varicose veins of left ankle   Prenatal Labs:  ABO, Rh: --/--/A POS (01/03 2245) Antibody: NEG (01/03 2245) Rubella: Immune (06/13 0000)  RPR: Nonreactive (06/13 0000)  HBsAg: Negative (06/13 0000)  HIV: Non-reactive (06/13 0000)  GTT: 97 GBS: Positive (12/04 0000)  NT WNL AFP- negative Normal CUS, female, posterior placenta    Admitting Diagnosis: SROM at term   Secondary Diagnoses: Patient Active Problem List   Diagnosis Date Noted  . Postpartum care following vaginal delivery (1/4) 09/15/2017  .  vacuum-assisted vaginal delivery 09/14/2017  . Indication for care in labor or delivery 09/13/2017    Augmentation: Pitocin  Complications: None  Date of Delivery: 09/14/17 Delivered By: Dr. Benjie Karvonen Delivery Type: Vacuum-assisted vaginal delivery  Anesthesia: epidural Placenta: sponatneous Laceration: (2nd degree, no extension) Episiotomy: median  Newborn Data: Live born female  Birth Weight: 7 lb 12.7 oz (3535 g) APGAR: 8, 8  Newborn Delivery   Birth date/time:  09/14/2017 17:52:00 Delivery type:  Vaginal, Vacuum (Extractor)     Hospital/Postpartum Course  (Vaginal Delivery): Pt. Admitted for labor with SROM and positive GBS.  Pt. Delivered by VAVD with episiotomy due to OP position and maternal exhaustion - see delivery note for further details.  Patient's postpartum course was complicated by newborn with bilateral cleft palate with feeding difficulties.  By time of discharge on PPD#2 , her pain was controlled on oral pain medications; she had appropriate lochia and was ambulating, voiding without difficulty and  tolerating regular diet.  She was deemed stable for discharge to home.  Her baby will plan to stay as the baby patient to see speech and OT.   Labs: CBC Latest Ref Rng & Units 09/15/2017 09/13/2017  WBC 4.0 - 10.5 K/uL 15.1(H) 13.5(H)  Hemoglobin 12.0 - 15.0 g/dL 10.2(L) 11.3(L)  Hematocrit 36.0 - 46.0 % 31.3(L) 34.8(L)  Platelets 150 - 400 K/uL 217 234   A POS  Physical exam:  BP (!) 100/55 (BP Location: Right Arm)   Pulse 88   Temp 98.2 F (36.8 C) (Axillary)   Resp 18   Ht 5\' 2"  (1.575 m)   Wt 78.5 kg (173 lb)   LMP 12/12/2016   SpO2 99%   Breastfeeding? Unknown   BMI 31.64 kg/m  General: alert and no distress Pulm: normal respiratory effort Lochia: appropriate Abdomen: soft, NT Uterine Fundus: firm, below umbilicus Perineum: healing well, no significant erythema, no significant edema Extremities: No evidence of DVT seen on physical exam. No lower extremity edema.   Disposition: stable, discharge to home Baby Feeding: breast milk and formula Baby Disposition: inpatient for cleft palate  Contraception: unsure  Rh Immune globulin given: N/A Rubella vaccine given: N/A Tdap vaccine given in AP or PP setting: UTD Flu vaccine given in AP or PP setting: UTD   Plan:  Aluel Schwarz was discharged to home in good condition. Follow-up appointment at [facility] in 2 weeks.  Discharge Instructions: Per After Visit Summary. Activity: Advance as tolerated. Pelvic rest for 6 weeks.  Refer to After Visit Summary Diet: Regular, Heart Healthy Discharge Medications: Allergies as of 09/16/2017   No Known Allergies     Medication List  STOP taking these medications   calcium carbonate 500 MG chewable tablet Commonly known as:  TUMS - dosed in mg elemental calcium     TAKE these medications   acetaminophen 325 MG tablet Commonly known as:  TYLENOL Take 650 mg by mouth every 6 (six) hours as needed for moderate pain or headache.   ibuprofen 600 MG tablet Commonly  known as:  ADVIL,MOTRIN Take 1 tablet (600 mg total) by mouth every 6 (six) hours.   pantoprazole 40 MG tablet Commonly known as:  PROTONIX Take 40 mg by mouth daily.   prenatal multivitamin Tabs tablet Take 1 tablet by mouth daily at 12 noon.      Outpatient follow up:  Follow-up Information    Aloha Gell, MD. Schedule an appointment as soon as possible for a visit in 2 week(s).   Specialty:  Obstetrics and Gynecology Why:  Interval visit for EPDS  Contact information: [place] [id number] [phone]           Signed:  Lars Pinks, MSN, CNM [facility]

## 2017-09-17 ENCOUNTER — Ambulatory Visit: Payer: Self-pay

## 2017-09-17 NOTE — Addendum Note (Signed)
Addendum  created 09/17/17 1357 by Janeece Riggers, MD   [facility]raprocedure Staff edited

## 2017-09-17 NOTE — Lactation Note (Signed)
This note was copied from a baby's chart. Lactation Consultation Note  Patient Name: Girl Marthella Osorno [id number] Date: 09/17/2017 Reason for consult: Follow-up assessment;Other (Comment) Baby  Is 59 hours old  LC reviewed and  updated the doc flow sheets  LC praised mom ,and family for working the plan well.  LC recommended gradually increasing the volume and Mom mentioned she had done so to 28 ml last feeding.  And LC mentioned to increase 5 ml up at a feeding over night and  See how the baby tolerates it.  Baby was on the double photo blanket when [facility] entered the room.  Per mom has been pumping but was so tired this after noon , decided  To take a nap.  Also the speech therapist gave the mom the option to use the [facility] bottle  Or the special Dr. Owens Shark. ( baby does well on both )      Maternal Data    Feeding Feeding Type: Bottle Fed - Formula  LATCH Score                   Interventions Interventions: Breast feeding basics reviewed  Lactation Tools Discussed/Used     Consult Status Consult Status: Follow-up Date: 09/18/17 Follow-up type: In-patient    [place] 09/17/2017, 4:55 PM

## 2017-09-20 ENCOUNTER — Inpatient Hospital Stay (HOSPITAL_COMMUNITY): Payer: Commercial Managed Care - PPO
